# Patient Record
Sex: Female | Born: 1960 | Race: Black or African American | Hispanic: No | Marital: Single | Smoking: Never smoker
Health system: Southern US, Community
[De-identification: ages and names within clinical notes are randomized; demographics above are authoritative.]

## PROBLEM LIST (undated history)

## (undated) DIAGNOSIS — M19071 Primary osteoarthritis, right ankle and foot: Secondary | ICD-10-CM

## (undated) DIAGNOSIS — M545 Low back pain, unspecified: Secondary | ICD-10-CM

## (undated) DIAGNOSIS — K219 Gastro-esophageal reflux disease without esophagitis: Secondary | ICD-10-CM

## (undated) DIAGNOSIS — G8929 Other chronic pain: Secondary | ICD-10-CM

## (undated) DIAGNOSIS — M1990 Unspecified osteoarthritis, unspecified site: Secondary | ICD-10-CM

## (undated) DIAGNOSIS — J302 Other seasonal allergic rhinitis: Secondary | ICD-10-CM

## (undated) HISTORY — DX: Primary osteoarthritis, right ankle and foot: M19.071

## (undated) HISTORY — DX: Other seasonal allergic rhinitis: J30.2

## (undated) HISTORY — DX: Gastro-esophageal reflux disease without esophagitis: K21.9

---

## 2015-03-12 ENCOUNTER — Encounter (HOSPITAL_COMMUNITY): Payer: Self-pay | Admitting: *Deleted

## 2015-03-12 ENCOUNTER — Emergency Department (HOSPITAL_COMMUNITY)
Admission: EM | Admit: 2015-03-12 | Discharge: 2015-03-12 | Disposition: A | Discharge status: Home / Self Care | Payer: No Typology Code available for payment source | Attending: Emergency Medicine | Admitting: Emergency Medicine

## 2015-03-12 DIAGNOSIS — Y998 Other external cause status: Secondary | ICD-10-CM | POA: Insufficient documentation

## 2015-03-12 DIAGNOSIS — S24109A Unspecified injury at unspecified level of thoracic spinal cord, initial encounter: Secondary | ICD-10-CM | POA: Insufficient documentation

## 2015-03-12 DIAGNOSIS — M545 Low back pain, unspecified: Secondary | ICD-10-CM

## 2015-03-12 DIAGNOSIS — R202 Paresthesia of skin: Secondary | ICD-10-CM

## 2015-03-12 DIAGNOSIS — Y9389 Activity, other specified: Secondary | ICD-10-CM | POA: Diagnosis not present

## 2015-03-12 DIAGNOSIS — Y92410 Unspecified street and highway as the place of occurrence of the external cause: Secondary | ICD-10-CM | POA: Insufficient documentation

## 2015-03-12 DIAGNOSIS — G8929 Other chronic pain: Secondary | ICD-10-CM | POA: Insufficient documentation

## 2015-03-12 DIAGNOSIS — S3992XA Unspecified injury of lower back, initial encounter: Secondary | ICD-10-CM | POA: Insufficient documentation

## 2015-03-12 DIAGNOSIS — S4992XA Unspecified injury of left shoulder and upper arm, initial encounter: Secondary | ICD-10-CM | POA: Insufficient documentation

## 2015-03-12 DIAGNOSIS — M549 Dorsalgia, unspecified: Secondary | ICD-10-CM

## 2015-03-12 DIAGNOSIS — Z8739 Personal history of other diseases of the musculoskeletal system and connective tissue: Secondary | ICD-10-CM | POA: Diagnosis not present

## 2015-03-12 DIAGNOSIS — S4991XA Unspecified injury of right shoulder and upper arm, initial encounter: Secondary | ICD-10-CM | POA: Insufficient documentation

## 2015-03-12 HISTORY — DX: Unspecified osteoarthritis, unspecified site: M19.90

## 2015-03-12 HISTORY — DX: Low back pain: M54.5

## 2015-03-12 HISTORY — DX: Low back pain, unspecified: M54.50

## 2015-03-12 HISTORY — DX: Other chronic pain: G89.29

## 2015-03-12 MED ORDER — CYCLOBENZAPRINE HCL 10 MG PO TABS: 10.0000 mg | ORAL_TABLET | Freq: Three times a day (TID) | ORAL | Status: DC | PRN

## 2015-03-12 MED ORDER — IBUPROFEN 800 MG PO TABS: 800.0000 mg | ORAL_TABLET | Freq: Three times a day (TID) | ORAL | Status: DC | PRN
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
## 2015-10-22 DIAGNOSIS — F331 Major depressive disorder, recurrent, moderate: Secondary | ICD-10-CM | POA: Diagnosis not present

## 2016-02-26 DIAGNOSIS — F331 Major depressive disorder, recurrent, moderate: Secondary | ICD-10-CM | POA: Diagnosis not present

## 2016-05-30 ENCOUNTER — Encounter (HOSPITAL_COMMUNITY): Payer: Self-pay

## 2016-05-30 ENCOUNTER — Emergency Department (HOSPITAL_COMMUNITY)
Admission: EM | Admit: 2016-05-30 | Discharge: 2016-05-30 | Disposition: A | Discharge status: Home / Self Care | Payer: No Typology Code available for payment source | Attending: Emergency Medicine | Admitting: Emergency Medicine

## 2016-05-30 DIAGNOSIS — Y92410 Unspecified street and highway as the place of occurrence of the external cause: Secondary | ICD-10-CM | POA: Insufficient documentation

## 2016-05-30 DIAGNOSIS — T148XXA Other injury of unspecified body region, initial encounter: Secondary | ICD-10-CM

## 2016-05-30 DIAGNOSIS — S3992XA Unspecified injury of lower back, initial encounter: Secondary | ICD-10-CM | POA: Diagnosis present

## 2016-05-30 DIAGNOSIS — Y939 Activity, unspecified: Secondary | ICD-10-CM | POA: Diagnosis not present

## 2016-05-30 DIAGNOSIS — S29012A Strain of muscle and tendon of back wall of thorax, initial encounter: Secondary | ICD-10-CM | POA: Insufficient documentation

## 2016-05-30 DIAGNOSIS — S161XXA Strain of muscle, fascia and tendon at neck level, initial encounter: Secondary | ICD-10-CM | POA: Diagnosis not present

## 2016-05-30 DIAGNOSIS — Y999 Unspecified external cause status: Secondary | ICD-10-CM | POA: Diagnosis not present

## 2016-05-30 MED ORDER — METHOCARBAMOL 500 MG PO TABS: 500.0000 mg | ORAL_TABLET | Freq: Two times a day (BID) | ORAL | 0 refills | Status: DC | PRN

## 2016-05-30 MED ORDER — NAPROXEN 500 MG PO TABS: 500.0000 mg | ORAL_TABLET | Freq: Two times a day (BID) | ORAL | 0 refills | Status: DC | PRN

## 2016-05-30 NOTE — Discharge Instructions (Signed)
Is very important that you schedule a follow-up appointment with a primary care provider. You are the pressure was slightly elevated while in the ED today. When you see your primary physician, let them know this. Use Robaxin (your muscle relaxer) as needed. Naproxen is your anti-inflammatory/pain reliever. Return to ER for new or worsening symptoms, any additional concerns.
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
## 2016-06-10 DIAGNOSIS — F331 Major depressive disorder, recurrent, moderate: Secondary | ICD-10-CM | POA: Diagnosis not present

## 2016-09-14 ENCOUNTER — Emergency Department (HOSPITAL_COMMUNITY): Payer: Medicare Other

## 2016-09-14 ENCOUNTER — Encounter (HOSPITAL_COMMUNITY): Payer: Self-pay

## 2016-09-14 ENCOUNTER — Emergency Department (HOSPITAL_COMMUNITY)
Admission: EM | Admit: 2016-09-14 | Discharge: 2016-09-14 | Disposition: A | Discharge status: Home / Self Care | Payer: Medicare Other | Attending: Emergency Medicine | Admitting: Emergency Medicine

## 2016-09-14 DIAGNOSIS — M79645 Pain in left finger(s): Secondary | ICD-10-CM | POA: Diagnosis not present

## 2016-09-14 DIAGNOSIS — M25571 Pain in right ankle and joints of right foot: Secondary | ICD-10-CM | POA: Diagnosis not present

## 2016-09-14 DIAGNOSIS — Y999 Unspecified external cause status: Secondary | ICD-10-CM | POA: Insufficient documentation

## 2016-09-14 DIAGNOSIS — S6992XA Unspecified injury of left wrist, hand and finger(s), initial encounter: Secondary | ICD-10-CM | POA: Diagnosis not present

## 2016-09-14 DIAGNOSIS — Y939 Activity, unspecified: Secondary | ICD-10-CM | POA: Insufficient documentation

## 2016-09-14 DIAGNOSIS — S8991XA Unspecified injury of right lower leg, initial encounter: Secondary | ICD-10-CM | POA: Diagnosis not present

## 2016-09-14 DIAGNOSIS — S4992XA Unspecified injury of left shoulder and upper arm, initial encounter: Secondary | ICD-10-CM | POA: Diagnosis not present

## 2016-09-14 DIAGNOSIS — M25512 Pain in left shoulder: Secondary | ICD-10-CM | POA: Insufficient documentation

## 2016-09-14 DIAGNOSIS — M79661 Pain in right lower leg: Secondary | ICD-10-CM | POA: Diagnosis not present

## 2016-09-14 DIAGNOSIS — M79604 Pain in right leg: Secondary | ICD-10-CM | POA: Insufficient documentation

## 2016-09-14 DIAGNOSIS — Z79899 Other long term (current) drug therapy: Secondary | ICD-10-CM | POA: Insufficient documentation

## 2016-09-14 DIAGNOSIS — M7989 Other specified soft tissue disorders: Secondary | ICD-10-CM | POA: Diagnosis not present

## 2016-09-14 DIAGNOSIS — M79642 Pain in left hand: Secondary | ICD-10-CM | POA: Diagnosis not present

## 2016-09-14 DIAGNOSIS — M25522 Pain in left elbow: Secondary | ICD-10-CM | POA: Diagnosis not present

## 2016-09-14 DIAGNOSIS — Y92410 Unspecified street and highway as the place of occurrence of the external cause: Secondary | ICD-10-CM | POA: Insufficient documentation

## 2016-09-14 MED ORDER — CYCLOBENZAPRINE HCL 10 MG PO TABS: 10.0000 mg | ORAL_TABLET | Freq: Three times a day (TID) | ORAL | 0 refills | Status: DC | PRN

## 2016-09-14 MED ORDER — NAPROXEN 375 MG PO TABS
375.0000 mg | ORAL_TABLET | Freq: Two times a day (BID) | ORAL | 0 refills | Status: DC
Start: 2016-09-14 — End: 2018-02-11

## 2016-09-14 MED ORDER — OXYCODONE-ACETAMINOPHEN 5-325 MG PO TABS
ORAL_TABLET | ORAL | Status: AC
  Filled 2016-09-14: qty 1

## 2016-09-14 MED ORDER — OXYCODONE-ACETAMINOPHEN 5-325 MG PO TABS
1.0000 | ORAL_TABLET | ORAL | Status: DC | PRN
  Administered 2016-09-14: 1 via ORAL

## 2016-09-14 MED ORDER — OXYCODONE-ACETAMINOPHEN 5-325 MG PO TABS
1.0000 | ORAL_TABLET | Freq: Once | ORAL | Status: DC
  Filled 2016-09-14: qty 1
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
## 2016-09-14 NOTE — ED Notes (Signed)
Patient transported to X-ray 
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
## 2016-09-14 NOTE — Progress Notes (Signed)
Orthopedic Tech Progress Note Patient Details:  Gates RiggKaren Mcdevitt 06/03/1961 [id number]  Ortho Devices Type of Ortho Device: Arm sling, Thumb velcro splint Ortho Device/Splint Location: LUE Ortho Device/Splint Interventions: Ordered, Application   Jennye MoccasinHughes, Loi Rennaker Craig 09/14/2016, 10:48 PM
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
## 2016-10-21 DIAGNOSIS — F331 Major depressive disorder, recurrent, moderate: Secondary | ICD-10-CM | POA: Diagnosis not present

## 2016-10-31 DIAGNOSIS — F330 Major depressive disorder, recurrent, mild: Secondary | ICD-10-CM | POA: Diagnosis not present

## 2016-11-02 DIAGNOSIS — F330 Major depressive disorder, recurrent, mild: Secondary | ICD-10-CM | POA: Diagnosis not present

## 2016-11-02 DIAGNOSIS — F331 Major depressive disorder, recurrent, moderate: Secondary | ICD-10-CM | POA: Diagnosis not present

## 2016-11-25 ENCOUNTER — Ambulatory Visit (INDEPENDENT_AMBULATORY_CARE_PROVIDER_SITE_OTHER): Payer: Medicare Other | Admitting: Physician Assistant

## 2016-11-30 DIAGNOSIS — F329 Major depressive disorder, single episode, unspecified: Secondary | ICD-10-CM | POA: Diagnosis not present

## 2016-11-30 DIAGNOSIS — K219 Gastro-esophageal reflux disease without esophagitis: Secondary | ICD-10-CM | POA: Diagnosis not present

## 2016-11-30 DIAGNOSIS — M19071 Primary osteoarthritis, right ankle and foot: Secondary | ICD-10-CM | POA: Diagnosis not present

## 2016-11-30 DIAGNOSIS — G894 Chronic pain syndrome: Secondary | ICD-10-CM | POA: Diagnosis not present

## 2016-11-30 DIAGNOSIS — M5136 Other intervertebral disc degeneration, lumbar region: Secondary | ICD-10-CM | POA: Diagnosis not present

## 2016-12-01 ENCOUNTER — Ambulatory Visit (INDEPENDENT_AMBULATORY_CARE_PROVIDER_SITE_OTHER): Payer: Medicare Other | Admitting: Physician Assistant

## 2016-12-20 ENCOUNTER — Encounter: Payer: Self-pay | Admitting: Physical Medicine & Rehabilitation

## 2016-12-30 ENCOUNTER — Encounter: Payer: Medicare Other | Attending: Physical Medicine & Rehabilitation | Admitting: Physical Medicine & Rehabilitation
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
## 2017-12-14 DIAGNOSIS — F3132 Bipolar disorder, current episode depressed, moderate: Secondary | ICD-10-CM | POA: Diagnosis not present

## 2017-12-14 DIAGNOSIS — F331 Major depressive disorder, recurrent, moderate: Secondary | ICD-10-CM | POA: Diagnosis not present

## 2018-01-23 ENCOUNTER — Emergency Department (HOSPITAL_COMMUNITY)
Admission: EM | Admit: 2018-01-23 | Discharge: 2018-01-23 | Disposition: A | Discharge status: Home / Self Care | Payer: Medicare Other | Attending: Emergency Medicine | Admitting: Emergency Medicine

## 2018-01-23 ENCOUNTER — Encounter (HOSPITAL_COMMUNITY): Payer: Self-pay

## 2018-01-23 ENCOUNTER — Other Ambulatory Visit: Payer: Self-pay

## 2018-01-23 ENCOUNTER — Emergency Department (HOSPITAL_COMMUNITY): Payer: Medicare Other

## 2018-01-23 DIAGNOSIS — R05 Cough: Secondary | ICD-10-CM | POA: Insufficient documentation

## 2018-01-23 DIAGNOSIS — Z5321 Procedure and treatment not carried out due to patient leaving prior to being seen by health care provider: Secondary | ICD-10-CM | POA: Insufficient documentation

## 2018-01-23 NOTE — ED Notes (Signed)
Patient called x 2 with no response.  Nurse observed patient leaving, patient stated she had an appointment at noon today and had to leave.

## 2018-01-23 NOTE — ED Triage Notes (Signed)
Pt reports she has chronic bronchitis and cough. States cough has gotten progressively worse over the last month and thinks she needs abx. Pt denies CP/SOB.

## 2018-01-25 ENCOUNTER — Encounter (HOSPITAL_COMMUNITY): Payer: Self-pay

## 2018-01-25 DIAGNOSIS — R05 Cough: Secondary | ICD-10-CM | POA: Diagnosis not present

## 2018-01-25 DIAGNOSIS — Z5321 Procedure and treatment not carried out due to patient leaving prior to being seen by health care provider: Secondary | ICD-10-CM | POA: Insufficient documentation

## 2018-01-26 ENCOUNTER — Encounter: Payer: Self-pay | Admitting: Family Medicine

## 2018-01-26 ENCOUNTER — Telehealth: Payer: Self-pay | Admitting: Family Medicine

## 2018-01-26 ENCOUNTER — Other Ambulatory Visit: Payer: Self-pay

## 2018-01-26 ENCOUNTER — Emergency Department (HOSPITAL_COMMUNITY)
Admission: EM | Admit: 2018-01-26 | Discharge: 2018-01-26 | Disposition: A | Discharge status: Home / Self Care | Payer: Medicare Other | Attending: Emergency Medicine | Admitting: Emergency Medicine

## 2018-01-26 ENCOUNTER — Ambulatory Visit (INDEPENDENT_AMBULATORY_CARE_PROVIDER_SITE_OTHER): Payer: Medicare Other | Admitting: Family Medicine

## 2018-01-26 VITALS — BP 132/84 | HR 74 | Temp 97.7°F | Ht 64.17 in | Wt 228.8 lb

## 2018-01-26 DIAGNOSIS — R05 Cough: Secondary | ICD-10-CM | POA: Diagnosis not present

## 2018-01-26 DIAGNOSIS — R062 Wheezing: Secondary | ICD-10-CM

## 2018-01-26 DIAGNOSIS — K219 Gastro-esophageal reflux disease without esophagitis: Secondary | ICD-10-CM

## 2018-01-26 DIAGNOSIS — J302 Other seasonal allergic rhinitis: Secondary | ICD-10-CM | POA: Diagnosis not present

## 2018-01-26 DIAGNOSIS — R053 Chronic cough: Secondary | ICD-10-CM

## 2018-01-26 MED ORDER — ALBUTEROL SULFATE HFA 108 (90 BASE) MCG/ACT IN AERS: 2.0000 | INHALATION_SPRAY | Freq: Four times a day (QID) | RESPIRATORY_TRACT | 3 refills | Status: DC | PRN

## 2018-01-26 MED ORDER — MONTELUKAST SODIUM 10 MG PO TABS: 10.0000 mg | ORAL_TABLET | Freq: Every day | ORAL | 3 refills | Status: DC

## 2018-01-26 MED ORDER — FLUTICASONE PROPIONATE 50 MCG/ACT NA SUSP: 1.0000 | Freq: Two times a day (BID) | NASAL | 6 refills | Status: DC

## 2018-01-26 MED ORDER — GUAIFENESIN-CODEINE 100-10 MG/5ML PO SOLN: 10.0000 mL | Freq: Two times a day (BID) | ORAL | 0 refills | Status: DC | PRN

## 2018-01-26 NOTE — ED Notes (Signed)
Pt called from triage with no answer 

## 2018-01-26 NOTE — Patient Instructions (Addendum)
1. 24 hour allergy medication daily, generic claritin ok.  2. Nasal saline washes 2-3 times a day    IF you received an x-ray today, you will receive an invoice from [facility]. Please contact [facility] at [phone] with questions or concerns regarding your invoice.   IF you received labwork today, you will receive an invoice from [facility]. Please contact [facility] at [phone] with questions or concerns regarding your invoice.   Our billing staff will not be able to assist you with questions regarding bills from these companies.  You will be contacted with the lab results as soon as they are available. The fastest way to get your results is to activate your My Chart account. Instructions are located on the last page of this paperwork. If you have not heard from [place] regarding the results in 2 weeks, please contact this office.         You can buy saline nose drops at a pharmacy, or you can make your own saline solution: Add 1 cup (240 mL) distilled water to a clean container. If you use tap water, boil it first to sterilize it, and then let it cool until it is lukewarm.  Add 0.5 tsp (2.5 g) salt to the water.  Add 0.5 tsp (2.5 g) baking soda. You can store homemade saline solution at room temperature for 3 days. To use homemade saline solution as a nasal wash: Fill a large medical syringe, squeeze bottle, or nasal cleansing pot (such as a Neti Pot) with the saline solution, insert the tip into your nostril, and squeeze gently.  Aim the stream of saline solution toward the back of your head, not toward the top.  The saline wash should go through the nose and out the mouth or the other side of the nose.  Blow your nose gently after the saline wash unless your doctor has told you not to blow your nose.  Repeat several times every day.  Clean the syringe or bottle after each use. Here are some things to think about: Do your saline wash before you use your other  nasal medicines. The wash will help your sinuses absorb the medicine.  You can warm the saline solution a little. But make sure it's not hot.  The saline wash may cause a burning feeling in your nose the first few times you use it. Most people get used to the wash after a few times.

## 2018-01-26 NOTE — Telephone Encounter (Signed)
Spoke with patient. Answered/clarified questions/concerns. Sent rx for cough suppressant. Insurance does not cover tessalon pearls.

## 2018-01-26 NOTE — Telephone Encounter (Signed)
Copied from CRM [id number]. Topic: Quick Communication - Rx Refill/Question >> Jan 26, 2018  3:20 PM Cipriano Bunker wrote: Medication:  she is asking for a prescription for her throat. Irritation in throat   Has the patient contacted their pharmacy? No. (Agent: If no, request that the patient contact the pharmacy for the refill.) Preferred Pharmacy (with phone number or street name):   [facility] [address] [place], [place] - [address] AT [facility] [address] [place] [address] Phone: [phone] Fax: [phone]   Agent: Please be advised that RX refills may take up to 3 business days. We ask that you follow-up with your pharmacy.

## 2018-01-26 NOTE — Telephone Encounter (Signed)
Seen this afternoon, is flonase prescription for patient request? Please advise.

## 2018-01-26 NOTE — Progress Notes (Signed)
5/10/20192:06 PM  Sabrina Mann 05-09-1961, 57 y.o. female [id number]  Chief Complaint  Patient presents with  . Cough    born with bronchitis has coughing episode and asking antibiotic    HPI:   Patient is a 57 y.o. female with past medical history significant for recurrent bronchitis who presents today for cough  Patient reports 2 months of constant cough, productive, white phlegm Intermittent SOB and wheezing She reports an irritated throat but not painful, runny nose, nasal congestion, sneezing, itchy L eye She denies ear pain, sinus pain or pressure She has occasional nausea but no vomiting Denies any fever or chills  She has been taking delsym cold/flu cough syrup. She is tired from not sleeping well for past 2 months  She reports flare ups several times a year She reports when she used to leave in [place] 3 years ago (recently moved back) she was prescribed nexium and loratidine,for similar symptoms and they resolved. Omeprazole not has good, but it is what is covered by insurance  Went to ER twice, left wo being seen CXR 01/23/18 - negative  Fall Risk  01/26/2018  Falls in the past year? No     Depression screen PHQ 2/9 01/26/2018  Decreased Interest 0  Down, Depressed, Hopeless 0  PHQ - 2 Score 0    No Known Allergies  Prior to Admission medications   Medication Sig Start Date End Date Taking? Authorizing Provider  omeprazole (PRILOSEC) 40 MG capsule Take 40 mg by mouth daily.   Yes [provider]  traMADol-acetaminophen (ULTRACET) 37.5-325 MG tablet Take 1 tablet by mouth every 6 (six) hours as needed.   Yes [provider]  buproprion  once a day  Past Medical History:  Diagnosis Date  . Arthritis of right ankle   . Chronic lower back pain   . GERD (gastroesophageal reflux disease)   . Seasonal allergies     History reviewed. No pertinent surgical history.  Social History   Tobacco Use  . Smoking status: Never Smoker  .  Smokeless tobacco: Never Used  Substance Use Topics  . Alcohol use: No    History reviewed. No pertinent family history.  Review of Systems  Constitutional: Negative for chills and fever.  HENT: Positive for congestion. Negative for ear pain, sinus pain and sore throat.   Eyes: Negative for pain and redness.  Respiratory: Positive for cough, sputum production, shortness of breath and wheezing.   Cardiovascular: Negative for chest pain, palpitations and leg swelling.  Gastrointestinal: Positive for heartburn. Negative for abdominal pain, blood in stool, constipation, diarrhea, melena, nausea and vomiting.  Musculoskeletal: Positive for back pain and joint pain.  Psychiatric/Behavioral: Positive for depression.   OBJECTIVE:  Blood pressure 132/84, pulse 74, temperature 97.7 F (36.5 C), temperature source Oral, height 5' 4.17" (1.63 m), weight 228 lb 12.8 oz (103.8 kg), SpO2 98 %.  Physical Exam  Constitutional: She is oriented to person, place, and time. She appears well-developed and well-nourished.  HENT:  Head: Normocephalic and atraumatic.  Right Ear: Hearing, tympanic membrane, external ear and ear canal normal.  Left Ear: Hearing, tympanic membrane, external ear and ear canal normal.  Nose: Mucosal edema and rhinorrhea present.  Mouth/Throat: Oropharynx is clear and moist.  Eyes: Pupils are equal, round, and reactive to light. Conjunctivae and EOM are normal.  Neck: Neck supple.  Cardiovascular: Normal rate, regular rhythm and normal heart sounds. Exam reveals no gallop and no friction rub.  No murmur heard. Pulmonary/Chest: Effort  normal and breath sounds normal. She has no wheezes. She has no rhonchi. She has no rales.  Lymphadenopathy:    She has no cervical adenopathy.  Neurological: She is alert and oriented to person, place, and time.  Skin: Skin is warm and dry.  Psychiatric: She has a normal mood and affect.  Nursing note and vitals reviewed.    ASSESSMENT and  PLAN  1. Seasonal allergies 2. Wheezing 3. Chronic cough 4. Gastroesophageal reflux disease, esophagitis presence not specified Patient with a history of chronic cough that is triggered by uncontrolled allergies and GERD. Discussed with patient that no indication for abx at this time. Discussed improving treatment of allergies. New meds r/se/b reviewed. rx albuterol inhaler as she reports wheezing. Cough syrup to provide respite for sleep while above measures start taking effect. Per patient, nexium not covered, discussed importance of LFM for mgt of GERD.   Other orders - fluticasone (FLONASE) 50 MCG/ACT nasal spray; Place 1 spray into both nostrils 2 (two) times daily. - montelukast (SINGULAIR) 10 MG tablet; Take 1 tablet (10 mg total) by mouth at bedtime. - albuterol (PROVENTIL HFA;VENTOLIN HFA) 108 (90 Base) MCG/ACT inhaler; Inhale 2 puffs into the lungs every 6 (six) hours as needed for wheezing or shortness of breath. - guaiFENesin-codeine 100-10 MG/5ML syrup; Take 10 mLs by mouth 2 (two) times daily as needed for cough.   Return in about 3 weeks (around 02/16/2018).    Myles Lipps, MD Primary Care at [facility] [address] [place], [place] [address] Ph.  [phone] Fax [phone]

## 2018-02-11 ENCOUNTER — Encounter: Payer: Self-pay | Admitting: Family Medicine

## 2018-02-11 DIAGNOSIS — M19071 Primary osteoarthritis, right ankle and foot: Secondary | ICD-10-CM | POA: Insufficient documentation

## 2018-02-11 DIAGNOSIS — K219 Gastro-esophageal reflux disease without esophagitis: Secondary | ICD-10-CM | POA: Insufficient documentation

## 2018-02-11 DIAGNOSIS — J302 Other seasonal allergic rhinitis: Secondary | ICD-10-CM | POA: Insufficient documentation

## 2018-02-15 ENCOUNTER — Ambulatory Visit: Payer: Medicare Other | Admitting: Family Medicine

## 2018-02-22 ENCOUNTER — Telehealth: Payer: Self-pay | Admitting: Family Medicine

## 2018-02-22 NOTE — Telephone Encounter (Signed)
Copied from CRM [id number]. Topic: Quick Communication - See Telephone Encounter >> Feb 22, 2018  9:41 AM Lorrine KinMcGee, Bayron Dalto B, NT wrote: CRM for notification. See Telephone encounter for: 02/22/18. Patient calling and states that she is still coughing really bad and would like to know if [facility] cough [facility] [place], [place] - [address] CB#: [phone]

## 2018-02-22 NOTE — Telephone Encounter (Signed)
Please schedule. Pt is due for f/u visit. Lets see if we can move up visit from schedule date.

## 2018-03-01 ENCOUNTER — Encounter: Payer: Self-pay | Admitting: Family Medicine

## 2018-03-01 ENCOUNTER — Other Ambulatory Visit: Payer: Self-pay

## 2018-03-01 ENCOUNTER — Ambulatory Visit (INDEPENDENT_AMBULATORY_CARE_PROVIDER_SITE_OTHER): Payer: Medicare Other | Admitting: Family Medicine

## 2018-03-01 VITALS — BP 116/86 | HR 100 | Temp 98.0°F | Ht 64.17 in | Wt 225.2 lb

## 2018-03-01 DIAGNOSIS — K219 Gastro-esophageal reflux disease without esophagitis: Secondary | ICD-10-CM

## 2018-03-01 DIAGNOSIS — J302 Other seasonal allergic rhinitis: Secondary | ICD-10-CM

## 2018-03-01 MED ORDER — TRIAMCINOLONE ACETONIDE 55 MCG/ACT NA AERO: 1.0000 | INHALATION_SPRAY | Freq: Two times a day (BID) | NASAL | 12 refills | Status: DC

## 2018-03-01 MED ORDER — MONTELUKAST SODIUM 10 MG PO TABS: 10.0000 mg | ORAL_TABLET | Freq: Every day | ORAL | 3 refills | Status: AC

## 2018-03-01 MED ORDER — ESOMEPRAZOLE MAGNESIUM 40 MG PO CPDR: 40.0000 mg | DELAYED_RELEASE_CAPSULE | Freq: Every day | ORAL | 2 refills | Status: DC

## 2018-03-01 NOTE — Progress Notes (Signed)
6/13/20193:15 PM  Sabrina Mann 1961-09-12, 57 y.o. female [id number]  Chief Complaint  Patient presents with  . Follow-up    couggh has gotten a lot better, only coughs when she eats spicy foods    HPI:   Patient is a 57 y.o. female with past medical history significant for seasonal allergies and GERD who presents today for followup  Cough much better Still having sore throat, irritation Omeprazole 40mg  not providing relief, she would like to get back on nexium flonase is also causing some irritation after each use She has no acute concerns today  Fall Risk  03/01/2018 01/26/2018  Falls in the past year? No No     Depression screen [facility] 2/9 03/01/2018 01/26/2018  Decreased Interest 0 0  Down, Depressed, Hopeless 0 0  PHQ - 2 Score 0 0    No Known Allergies  Prior to Admission medications   Medication Sig Start Date End Date Taking? Authorizing Provider  albuterol (PROVENTIL HFA;VENTOLIN HFA) 108 (90 Base) MCG/ACT inhaler Inhale 2 puffs into the lungs every 6 (six) hours as needed for wheezing or shortness of breath. 01/26/18  Yes Myles Lipps, MD  buPROPion (WELLBUTRIN) 75 MG tablet Take 1 tablet by mouth daily. 01/05/17  Yes [provider]  fluticasone (FLONASE) 50 MCG/ACT nasal spray Place 1 spray into both nostrils 2 (two) times daily. 01/26/18  Yes Myles Lipps, MD  guaiFENesin-codeine 100-10 MG/5ML syrup Take 10 mLs by mouth 2 (two) times daily as needed for cough. 01/26/18   Myles Lipps, MD  montelukast (SINGULAIR) 10 MG tablet Take 1 tablet (10 mg total) by mouth at bedtime. Patient not taking: Reported on 03/01/2018 01/26/18   Myles Lipps, MD  omeprazole (PRILOSEC) 40 MG capsule Take 40 mg by mouth daily as needed. 11/30/16   [provider]  traMADol-acetaminophen (ULTRACET) 37.5-325 MG tablet Take 1 tablet by mouth every 6 (six) hours as needed.    [provider]    Past Medical History:  Diagnosis Date  . Arthritis of  right ankle   . Chronic lower back pain   . GERD (gastroesophageal reflux disease)   . Seasonal allergies     History reviewed. No pertinent surgical history.  Social History   Tobacco Use  . Smoking status: Never Smoker  . Smokeless tobacco: Never Used  Substance Use Topics  . Alcohol use: No    Family History  Problem Relation Age of Onset  . Healthy Mother   . Healthy Father     ROS Per hpi  OBJECTIVE:  Blood pressure 116/86, pulse 100, temperature 98 F (36.7 C), temperature source Oral, height 5' 4.17" (1.63 m), weight 225 lb 3.2 oz (102.2 kg), SpO2 96 %.  Physical Exam  Constitutional: She is oriented to person, place, and time. She appears well-developed and well-nourished.  HENT:  Head: Normocephalic and atraumatic.  Mouth/Throat: Mucous membranes are normal.  Eyes: Pupils are equal, round, and reactive to light. EOM are normal. No scleral icterus.  Neck: Neck supple.  Pulmonary/Chest: Effort normal.  Neurological: She is alert and oriented to person, place, and time.  Skin: Skin is warm and dry.  Psychiatric: She has a normal mood and affect.  Nursing note and vitals reviewed.   ASSESSMENT and PLAN  1. Seasonal allergies 2. Gastroesophageal reflux disease, esophagitis presence not specified Improving. Changes meds per below due to side effects or lack of therapeutic effect. Continue with LFM.  Other orders - montelukast (SINGULAIR) 10  MG tablet; Take 1 tablet (10 mg total) by mouth at bedtime. - esomeprazole (NEXIUM) 40 MG capsule; Take 1 capsule (40 mg total) by mouth daily at 12 noon. - triamcinolone (NASACORT) 55 MCG/ACT AERO nasal inhaler; Place 1 spray into the nose 2 (two) times daily.  Return in about 1 month (around 03/29/2018).    Myles LippsIrma M Santiago, MD Primary Care at [facility] [address] [place], [place] [address] Ph.  [phone] Fax [phone]

## 2018-03-01 NOTE — Patient Instructions (Signed)
     IF you received an x-ray today, you will receive an invoice from [facility]. Please contact [facility] at [phone] with questions or concerns regarding your invoice.   IF you received labwork today, you will receive an invoice from [facility]. Please contact [facility] at [phone] with questions or concerns regarding your invoice.   Our billing staff will not be able to assist you with questions regarding bills from these companies.  You will be contacted with the lab results as soon as they are available. The fastest way to get your results is to activate your My Chart account. Instructions are located on the last page of this paperwork. If you have not heard from us regarding the results in 2 weeks, please contact this office.     

## 2018-03-26 ENCOUNTER — Ambulatory Visit: Payer: Medicare Other | Admitting: Family Medicine

## 2018-06-19 DIAGNOSIS — F331 Major depressive disorder, recurrent, moderate: Secondary | ICD-10-CM | POA: Diagnosis not present

## 2018-09-26 DIAGNOSIS — H524 Presbyopia: Secondary | ICD-10-CM | POA: Diagnosis not present

## 2018-09-26 DIAGNOSIS — H5203 Hypermetropia, bilateral: Secondary | ICD-10-CM | POA: Diagnosis not present

## 2018-09-26 DIAGNOSIS — H52223 Regular astigmatism, bilateral: Secondary | ICD-10-CM | POA: Diagnosis not present

## 2019-03-14 ENCOUNTER — Telehealth: Payer: Self-pay | Admitting: Family Medicine

## 2019-03-14 ENCOUNTER — Telehealth: Payer: Self-pay

## 2019-03-14 NOTE — Telephone Encounter (Signed)
Dr. Everlene Farrier completed [employer] handicapped plaque form for pt, pt was also asked to make a f/u appt.

## 2019-03-14 NOTE — Telephone Encounter (Signed)
Pt dropped off NON FMLA paperwork to be signed by provider for handicap placard. Pt wants to pick up signed form today. In provider mailbox at nurses station.

## 2019-03-25 NOTE — Telephone Encounter (Signed)
Placed in the front box for pick up, you also asked that this pt make an appt. Spoke with her. Note sure if it was picked up, not in box anymore. Called, na

## 2019-11-27 ENCOUNTER — Telehealth: Payer: Self-pay | Admitting: *Deleted

## 2019-11-27 NOTE — Telephone Encounter (Signed)
SCHEDULE AWV 

## 2020-02-14 ENCOUNTER — Ambulatory Visit (INDEPENDENT_AMBULATORY_CARE_PROVIDER_SITE_OTHER): Payer: Medicare Other | Admitting: Family Medicine

## 2020-02-14 ENCOUNTER — Other Ambulatory Visit: Payer: Self-pay

## 2020-02-14 ENCOUNTER — Encounter: Payer: Self-pay | Admitting: Family Medicine

## 2020-02-14 VITALS — BP 128/74 | HR 71 | Temp 98.2°F | Ht 64.17 in | Wt 226.6 lb

## 2020-02-14 DIAGNOSIS — F4321 Adjustment disorder with depressed mood: Secondary | ICD-10-CM | POA: Diagnosis not present

## 2020-02-14 DIAGNOSIS — M7711 Lateral epicondylitis, right elbow: Secondary | ICD-10-CM

## 2020-02-14 MED ORDER — BUPROPION HCL 75 MG PO TABS: 75.0000 mg | ORAL_TABLET | Freq: Every day | ORAL | 5 refills | Status: DC

## 2020-02-14 MED ORDER — HYDROCODONE-ACETAMINOPHEN 5-325 MG PO TABS: 1.0000 | ORAL_TABLET | Freq: Four times a day (QID) | ORAL | 0 refills | Status: DC | PRN

## 2020-02-14 MED ORDER — MELOXICAM 15 MG PO TABS: 15.0000 mg | ORAL_TABLET | Freq: Every day | ORAL | 2 refills | Status: DC | PRN

## 2020-02-14 NOTE — Progress Notes (Signed)
5/28/20219:45 AM  Gates Rigg 1961/02/10, 58 y.o., female [id number]  Chief Complaint  Patient presents with  . Hypertension    asking for a bp monitor to take bp at home  . Pain    right arm pain that radiates under the arm for the past 2 weeks, asking for a stronger med than the tramadol    HPI:   Patient is a 59 y.o. female with past medical history significant for depression, seasonal allergies, GERD who presents today for several concerns  Right elbow pain that travels down and up her arm Started about 2 weeks ago, abrupt onset Pain is constant, she has been taking tramadol-apap When at rest she feels it pull down from her neck all the way down into her pinkie Pain is heavy, achy and sharp and started having tingling of her fingertips yesterday No neck pain but right shoulder blade is painful Left handed No swelling, redness warmth of joints No weakness  Requesting to restart wellbutrin  Has not been on this for a while Has noticed return of depression with increased problems with intertest and focus In the past these sx resolved completely with wellbutrin She would like to restart  Depression screen [facility] 2/9 02/14/2020 03/01/2018 01/26/2018  Decreased Interest 0 0 0  Down, Depressed, Hopeless 0 0 0  PHQ - 2 Score 0 0 0    Fall Risk  02/14/2020 03/01/2018 01/26/2018  Falls in the past year? 0 No No  Number falls in past yr: 0 - -  Injury with Fall? 0 - -     No Known Allergies  Prior to Admission medications   Medication Sig Start Date End Date Taking? Authorizing Provider  albuterol (PROVENTIL HFA;VENTOLIN HFA) 108 (90 Base) MCG/ACT inhaler Inhale 2 puffs into the lungs every 6 (six) hours as needed for wheezing or shortness of breath. 01/26/18  Yes Myles Lipps, MD  buPROPion (WELLBUTRIN) 75 MG tablet Take 1 tablet by mouth daily. 01/05/17  Yes [provider]  esomeprazole (NEXIUM) 40 MG capsule Take 1 capsule (40 mg total) by mouth daily at 12 noon.  03/01/18  Yes Myles Lipps, MD  montelukast (SINGULAIR) 10 MG tablet Take 1 tablet (10 mg total) by mouth at bedtime. 03/01/18  Yes Myles Lipps, MD  traMADol-acetaminophen (ULTRACET) 37.5-325 MG tablet Take 1 tablet by mouth every 6 (six) hours as needed.   Yes [provider]  triamcinolone (NASACORT) 55 MCG/ACT AERO nasal inhaler Place 1 spray into the nose 2 (two) times daily. 03/01/18  Yes Myles Lipps, MD    Past Medical History:  Diagnosis Date  . Arthritis of right ankle   . Chronic lower back pain   . GERD (gastroesophageal reflux disease)   . Seasonal allergies     No past surgical history on file.  Social History   Tobacco Use  . Smoking status: Never Smoker  . Smokeless tobacco: Never Used  Substance Use Topics  . Alcohol use: No    Family History  Problem Relation Age of Onset  . Healthy Mother   . Healthy Father     ROS Per hpi  OBJECTIVE:  Today's Vitals   02/14/20 0936  BP: 128/74  Pulse: 71  Temp: 98.2 F (36.8 C)  SpO2: 99%  Weight: 226 lb 9.6 oz (102.8 kg)  Height: 5' 4.17" (1.63 m)   Body mass index is 38.69 kg/m.   Physical Exam Vitals and nursing note reviewed.  Constitutional:  Appearance: She is well-developed.  HENT:     Head: Normocephalic and atraumatic.     Mouth/Throat:     Pharynx: No oropharyngeal exudate.  Eyes:     General: No scleral icterus.    Conjunctiva/sclera: Conjunctivae normal.     Pupils: Pupils are equal, round, and reactive to light.  Cardiovascular:     Rate and Rhythm: Normal rate and regular rhythm.     Heart sounds: Normal heart sounds. No murmur. No friction rub. No gallop.   Pulmonary:     Effort: Pulmonary effort is normal.     Breath sounds: Normal breath sounds. No wheezing or rales.  Musculoskeletal:     Right shoulder: Normal.     Right elbow: No swelling. Normal range of motion. Tenderness present in lateral epicondyle.     Right wrist: Normal.     Cervical back: Neck  supple.  Skin:    General: Skin is warm and dry.  Neurological:     Mental Status: She is alert and oriented to person, place, and time.     No results found for this or any previous visit (from the past 24 hour(s)).  No results found.   ASSESSMENT and PLAN  1. Right lateral epicondylitis Discussed supportive measures, new meds r/se/b and RTC precautions. Patient educational handout given. pmp reviewed  2. Adjustment disorder with depressed mood Restarting wellbutrin, reviewed r/se/b  Other orders - meloxicam (MOBIC) 15 MG tablet; Take 1 tablet (15 mg total) by mouth daily as needed for pain (take with food). - HYDROcodone-acetaminophen (NORCO) 5-325 MG tablet; Take 1 tablet by mouth every 6 (six) hours as needed. - buPROPion (WELLBUTRIN) 75 MG tablet; Take 1 tablet (75 mg total) by mouth daily.  Return in about 3 months (around 05/16/2020).    Rutherford Guys, MD Primary Care at [facility] [place], [place] [address] Ph.  [phone] Fax [phone]

## 2020-02-14 NOTE — Patient Instructions (Addendum)
   If you have lab work done today you will be contacted with your lab results within the next 2 weeks.  If you have not heard from us then please contact us. The fastest way to get your results is to register for My Chart.   IF you received an x-ray today, you will receive an invoice from [facility]. Please contact [facility] at [phone] with questions or concerns regarding your invoice.   IF you received labwork today, you will receive an invoice from [facility]. Please contact [facility] at [phone] with questions or concerns regarding your invoice.   Our billing staff will not be able to assist you with questions regarding bills from these companies.  You will be contacted with the lab results as soon as they are available. The fastest way to get your results is to activate your My Chart account. Instructions are located on the last page of this paperwork. If you have not heard from us regarding the results in 2 weeks, please contact this office.     Tennis Elbow Tennis elbow is swelling (inflammation) in your outer forearm, near your elbow. Swelling affects the tissues that connect muscle to bone (tendons). Tennis elbow can happen in any sport or job in which you use your elbow too much. It is caused by doing the same motion over and over. Tennis elbow can cause:  Pain and tenderness in your forearm and the outer part of your elbow. You may have pain all the time, or only when using the arm.  A burning feeling. This runs from your elbow through your arm.  Weak grip in your hand. Follow these instructions at home: Activity  Rest your elbow and wrist. Avoid activities that cause problems, as told by your doctor.  If told by your doctor, wear an elbow strap to reduce stress on the area.  Do physical therapy exercises as told.  If you lift an object, lift it with your palm facing up. This is easier on your elbow. Lifestyle  If your tennis elbow is caused  by sports, check your equipment and make sure that: ? You are using it correctly. ? It fits you well.  If your tennis elbow is caused by work or by using a computer, take breaks often to stretch your arm. Talk with your manager about how you can manage your condition at work. If you have a brace:  Wear the brace as told by your doctor. Remove it only as told by your doctor.  Loosen the brace if your fingers tingle, get numb, or turn cold and blue.  Keep the brace clean.  If the brace is not waterproof, ask your doctor if you may take the brace off for bathing. If you must keep the brace on while bathing: ? Do not let it get wet. ? Cover it with a watertight covering when you take a bath or a shower. General instructions   If told, put ice on the painful area: ? Put ice in a plastic bag. ? Place a towel between your skin and the bag. ? Leave the ice on for 20 minutes, 2-3 times a day.  Take over-the-counter and prescription medicines only as told by your doctor.  Keep all follow-up visits as told by your doctor. This is important. Contact a doctor if:  Your pain does not get better with treatment.  Your pain gets worse.  You have weakness in your forearm, hand, or fingers.  You cannot feel   your forearm, hand, or fingers. Summary  Tennis elbow is swelling (inflammation) in your outer forearm, near your elbow.  Tennis elbow is caused by doing the same motion over and over.  Rest your elbow and wrist. Avoid activities that cause problems, as told by your doctor.  If told, put ice on the painful area for 20 minutes, 2-3 times a day. This information is not intended to replace advice given to you by your health care provider. Make sure you discuss any questions you have with your health care provider. Document Revised: 06/01/2018 Document Reviewed: 06/20/2017 Elsevier Patient Education  2020 [employer] Inc[employer]  

## 2020-04-29 ENCOUNTER — Telehealth: Payer: Self-pay | Admitting: General Practice

## 2020-05-18 ENCOUNTER — Other Ambulatory Visit: Payer: Self-pay

## 2020-05-18 ENCOUNTER — Ambulatory Visit (INDEPENDENT_AMBULATORY_CARE_PROVIDER_SITE_OTHER): Payer: Medicare Other

## 2020-05-18 ENCOUNTER — Encounter: Payer: Self-pay | Admitting: Family Medicine

## 2020-05-18 ENCOUNTER — Ambulatory Visit (INDEPENDENT_AMBULATORY_CARE_PROVIDER_SITE_OTHER): Payer: Medicare Other | Admitting: Family Medicine

## 2020-05-18 VITALS — BP 140/90 | HR 73 | Temp 97.3°F | Ht 64.17 in | Wt 224.4 lb

## 2020-05-18 DIAGNOSIS — M797 Fibromyalgia: Secondary | ICD-10-CM | POA: Insufficient documentation

## 2020-05-18 DIAGNOSIS — M25571 Pain in right ankle and joints of right foot: Secondary | ICD-10-CM | POA: Diagnosis not present

## 2020-05-18 DIAGNOSIS — M19071 Primary osteoarthritis, right ankle and foot: Secondary | ICD-10-CM

## 2020-05-18 DIAGNOSIS — M47817 Spondylosis without myelopathy or radiculopathy, lumbosacral region: Secondary | ICD-10-CM | POA: Insufficient documentation

## 2020-05-18 DIAGNOSIS — M51379 Other intervertebral disc degeneration, lumbosacral region without mention of lumbar back pain or lower extremity pain: Secondary | ICD-10-CM | POA: Insufficient documentation

## 2020-05-18 DIAGNOSIS — M47816 Spondylosis without myelopathy or radiculopathy, lumbar region: Secondary | ICD-10-CM | POA: Insufficient documentation

## 2020-05-18 DIAGNOSIS — M25579 Pain in unspecified ankle and joints of unspecified foot: Secondary | ICD-10-CM | POA: Insufficient documentation

## 2020-05-18 DIAGNOSIS — M5137 Other intervertebral disc degeneration, lumbosacral region: Secondary | ICD-10-CM | POA: Insufficient documentation

## 2020-05-18 MED ORDER — MELOXICAM 15 MG PO TABS: 15.0000 mg | ORAL_TABLET | Freq: Every day | ORAL | 2 refills | Status: DC | PRN

## 2020-05-18 NOTE — Telephone Encounter (Signed)
Pt has left the office, however the visit was not complete. We were waiting on the X-ray results that she had just taken. As of right now they are not back yet. The provider has asked me to call pt to inform her that we are waiting on X-rays, however in the mean time we have referred pt to podiatry and also sent in some oral medication to assist with the pain until she is seen by the specialist. Pt stated "okay, sent it to where you want to".   Results just came back and the athirst has gotten worse. She can try to wear an ankle brace for that will help with the pain. I have called pt back and relayed the results. The pt has requested a letter with result and I stated understanding. We will send a copy of them to her.

## 2020-05-18 NOTE — Patient Instructions (Signed)
° ° ° °  If you have lab work done today you will be contacted with your lab results within the next 2 weeks.  If you have not heard from us then please contact us. The fastest way to get your results is to register for My Chart. ° ° °IF you received an x-ray today, you will receive an invoice from [facility]. Please contact  Radiology at [phone] with questions or concerns regarding your invoice.  ° °IF you received labwork today, you will receive an invoice from [facility]. Please contact [facility] at [phone] with questions or concerns regarding your invoice.  ° °Our billing staff will not be able to assist you with questions regarding bills from these companies. ° °You will be contacted with the lab results as soon as they are available. The fastest way to get your results is to activate your My Chart account. Instructions are located on the last page of this paperwork. If you have not heard from us regarding the results in 2 weeks, please contact this office. °  ° ° ° °

## 2020-05-18 NOTE — Progress Notes (Signed)
8/30/20218:24 AM  Sabrina Mann 06/12/1961, 59 y.o., female [id number]  Chief Complaint  Patient presents with  . Pain    medication tht used for pain is not working, requesting xray on right leg. Told yrs ago there is not cartridge in that leg, bone rubbing against bone    HPI:   Patient is a 59 y.o. female with past medical history significant for depression, seasonal allergies, GERD  who presents today for right ankle pain  Chronic right ankle pain that is worsening, now constant She is requesting new xrays She denies any injuries or surgeries She reports pain travels from ankle to lower calf She reports that her ankle remains swollen She reports being told there was no cartilage She is on disability due to this pain She last saw a specialist over 10 years ago She reports that she was last rx tramadol [facility] She has not been to [facility] over a year due to covid Ankle Xray from 2017 - [facility]       Depression screen [facility] 2/9 02/14/2020 03/01/2018 01/26/2018  Decreased Interest 0 0 0  Down, Depressed, Hopeless 0 0 0  PHQ - 2 Score 0 0 0    Fall Risk  02/14/2020 03/01/2018 01/26/2018  Falls in the past year? 0 No No  Number falls in past yr: 0 - -  Injury with Fall? 0 - -     No Known Allergies  Prior to Admission medications   Medication Sig Start Date End Date Taking? Authorizing Provider  albuterol (PROVENTIL HFA;VENTOLIN HFA) 108 (90 Base) MCG/ACT inhaler Inhale 2 puffs into the lungs every 6 (six) hours as needed for wheezing or shortness of breath. 01/26/18  Yes Myles Lipps, MD  buPROPion (WELLBUTRIN) 75 MG tablet Take 1 tablet (75 mg total) by mouth daily. 02/14/20  Yes Myles Lipps, MD  esomeprazole (NEXIUM) 40 MG capsule Take 1 capsule (40 mg total) by mouth daily at 12 noon. 03/01/18  Yes Myles Lipps, MD  meloxicam (MOBIC) 15 MG tablet Take 1 tablet (15 mg total) by mouth daily as needed for pain (take with food). 02/14/20  Yes Myles Lipps, MD    montelukast (SINGULAIR) 10 MG tablet Take 1 tablet (10 mg total) by mouth at bedtime. 03/01/18  Yes Myles Lipps, MD  traMADol-acetaminophen (ULTRACET) 37.5-325 MG tablet tramadol 37.5 mg-acetaminophen 325 mg tablet  Take 1 tablet 3 times a day by oral route as needed for 30 days.   Yes [provider]  triamcinolone (NASACORT) 55 MCG/ACT AERO nasal inhaler Place 1 spray into the nose 2 (two) times daily. 03/01/18  Yes Myles Lipps, MD    Past Medical History:  Diagnosis Date  . Arthritis of right ankle   . Chronic lower back pain   . GERD (gastroesophageal reflux disease)   . Seasonal allergies     No past surgical history on file.  Social History   Tobacco Use  . Smoking status: Never Smoker  . Smokeless tobacco: Never Used  Substance Use Topics  . Alcohol use: No    Family History  Problem Relation Age of Onset  . Healthy Mother   . Healthy Father     ROS Per hpi  OBJECTIVE:  Today's Vitals   05/18/20 0814  BP: 140/90  Pulse: 73  Temp: (!) 97.3 F (36.3 C)  SpO2: 96%  Weight: 224 lb 6.4 oz (101.8 kg)  Height: 5' 4.17" (1.63 m)   Body mass index  is 38.31 kg/m.   Physical Exam Vitals and nursing note reviewed.  Constitutional:      Appearance: She is well-developed.  HENT:     Head: Normocephalic and atraumatic.  Eyes:     General: No scleral icterus.    Conjunctiva/sclera: Conjunctivae normal.     Pupils: Pupils are equal, round, and reactive to light.  Pulmonary:     Effort: Pulmonary effort is normal.  Musculoskeletal:     Cervical back: Neck supple.     Right ankle: Swelling present. Tenderness present over the medial malleolus. Decreased range of motion. Normal pulse.     Right Achilles Tendon: Normal.     Comments: Pes planus  Skin:    General: Skin is warm and dry.  Neurological:     Mental Status: She is alert and oriented to person, place, and time.     No results found for this or any previous visit (from the past  24 hour(s)).  DG Ankle Complete Right  Result Date: 05/18/2020 CLINICAL DATA:  Worsening right ankle pain.  No reported injury. EXAM: RIGHT ANKLE - COMPLETE 3+ VIEW COMPARISON:  09/14/2016 right ankle radiographs FINDINGS: Diffuse right ankle soft tissue swelling. No fracture or subluxation. No suspicious focal osseous lesions. Moderate to severe right ankle joint osteoarthritis with loss of joint space and moderate marginal osteophytes, mildly progressive. Tiny plantar right calcaneal spur. No radiopaque foreign bodies. IMPRESSION: 1. Diffuse right ankle soft tissue swelling. No fracture or subluxation. 2. Mildly progressive moderate to severe right ankle joint osteoarthritis. Electronically Signed   By: Delbert Phenix M.D.   On: 05/18/2020 09:10     ASSESSMENT and PLAN  1. Arthritis of right ankle rx for melixicam given. Advised use brace. Xray worsening arthritis. Referring to podiatry.  - DG Ankle Complete Right - Ambulatory referral to Podiatry  Other orders - meloxicam (MOBIC) 15 MG tablet; Take 1 tablet (15 mg total) by mouth daily as needed for pain (take with food).  Return if symptoms worsen or fail to improve.    Myles Lipps, MD Primary Care at [facility] [address] [place], [place] [address] Ph.  [phone] Fax [phone]

## 2020-05-21 ENCOUNTER — Telehealth: Payer: Self-pay | Admitting: General Practice

## 2020-05-21 NOTE — Telephone Encounter (Signed)
I called pt after speaking with Dr. Leretha Pol. I informed the pt that per my conversation with the provider that the Meloxicam that was sent in was correct, and that Dr. Leretha Pol didn't want to give her a cream because she wanted the pt to be able to ware a brace. Pt seemed to understand this information and agreed to the treatment.

## 2020-05-21 NOTE — Telephone Encounter (Signed)
PT IS CALLING BECAUSE SHE WENT TO PICK UP HER PRESCRIPTION THAT WAS SUPPOSED  TO HAVE BEEN A CREAM SENT IN  AND WE SENT  meloxicam (MOBIC) 15 MG tablet [[id number]]   PLEASE ADVISE

## 2020-06-04 ENCOUNTER — Encounter: Payer: Self-pay | Admitting: Podiatry

## 2020-06-04 ENCOUNTER — Ambulatory Visit (INDEPENDENT_AMBULATORY_CARE_PROVIDER_SITE_OTHER): Payer: Medicare Other

## 2020-06-04 ENCOUNTER — Ambulatory Visit (INDEPENDENT_AMBULATORY_CARE_PROVIDER_SITE_OTHER): Payer: Medicare Other | Admitting: Podiatry

## 2020-06-04 ENCOUNTER — Other Ambulatory Visit: Payer: Self-pay

## 2020-06-04 DIAGNOSIS — M19071 Primary osteoarthritis, right ankle and foot: Secondary | ICD-10-CM

## 2020-06-04 NOTE — Progress Notes (Signed)
Subjective:  Patient ID: Sabrina Mann, female    DOB: [birth year],  MRN: [id number] HPI No chief complaint on file.   59 y.o. female presents with the above complaint.   ROS: Denies fever chills nausea vomiting muscle aches pains calf pain back pain chest pain shortness of breath.  Relates an injury to her right foot approximately 30 years ago and states that about 10 years ago she started to develop pain was seen by primary provider who suggested that she might have arthritis.  She has tried oral anti-inflammatories but she does not like to take it states that they have not worked for her nor have the narcotics work for her that she was provided.  Past Medical History:  Diagnosis Date  . Arthritis of right ankle   . Chronic lower back pain   . GERD (gastroesophageal reflux disease)   . Seasonal allergies    No past surgical history on file.  Current Outpatient Medications:  .  albuterol (PROVENTIL HFA;VENTOLIN HFA) 108 (90 Base) MCG/ACT inhaler, Inhale 2 puffs into the lungs every 6 (six) hours as needed for wheezing or shortness of breath., Disp: 1 Inhaler, Rfl: 3 .  buPROPion (WELLBUTRIN) 75 MG tablet, Take 1 tablet (75 mg total) by mouth daily., Disp: 30 tablet, Rfl: 5 .  esomeprazole (NEXIUM) 40 MG capsule, Take 1 capsule (40 mg total) by mouth daily at 12 noon., Disp: 30 capsule, Rfl: 2 .  meloxicam (MOBIC) 15 MG tablet, Take 1 tablet (15 mg total) by mouth daily as needed for pain (take with food)., Disp: 30 tablet, Rfl: 2 .  montelukast (SINGULAIR) 10 MG tablet, Take 1 tablet (10 mg total) by mouth at bedtime., Disp: 30 tablet, Rfl: 3 .  traMADol-acetaminophen (ULTRACET) 37.5-325 MG tablet, tramadol 37.5 mg-acetaminophen 325 mg tablet  Take 1 tablet 3 times a day by oral route as needed for 30 days., Disp: , Rfl:  .  triamcinolone (NASACORT) 55 MCG/ACT AERO nasal inhaler, Place 1 spray into the nose 2 (two) times daily., Disp: 1 Inhaler, Rfl: 12  No Known Allergies Review of  Systems Objective:  There were no vitals filed for this visit.  General: Well developed, nourished, in no acute distress, alert and oriented x3   Dermatological: Skin is warm, dry and supple bilateral. Nails x 10 are well maintained; remaining integument appears unremarkable at this time. There are no open sores, no preulcerative lesions, no rash or signs of infection present.  Vascular: Dorsalis Pedis artery and Posterior Tibial artery pedal pulses are 2/4 bilateral with immedate capillary fill time. Pedal hair growth present. No varicosities and no lower extremity edema present bilateral.   Neruologic: Grossly intact via light touch bilateral. Vibratory intact via tuning fork bilateral. Protective threshold with Semmes Wienstein monofilament intact to all pedal sites bilateral. Patellar and Achilles deep tendon reflexes 2+ bilateral. No Babinski or clonus noted bilateral.   Musculoskeletal: No gross boney pedal deformities bilateral. No pain, crepitus, or limitation noted with foot and ankle range of motion bilateral. Muscular strength 5/5 in all groups tested bilateral.  She has palpable hypertrophy of the bone around the ankle she has limited range of motion of the ankle joint particularly dorsiflexion plantarflexion is full however.  She has no crepitation on range of motion.  She has a slight shortening of the right leg compared to left.  Gait: Unassisted, Nonantalgic.    Radiographs:  Radiographs of the right foot ankle demonstrate an osseously mature individual with severe osseous breakdown around the  ankle.  It appears that she has had an old fracture of the posterior process of the talus an injury to the tibiotalar joint that has resulted in osteoarthritic changes hypertrophy of the bone joint space narrowing subchondral sclerosis and eburnation with cystic formation.  There is malalignment on anterior view.  Some subtalar joint involvement as well.    Assessment & Plan:    Assessment: Severe osteoarthritis ankle right.  Plan: Discussed etiology pathology conservative versus surgical therapies.  We talked about fusion or joint arthroplasty of the ankle today.  She states that it does not hurt bad enough for that.  Also gave her another option of an [place] type brace to hold the ankle stable and prevent range of motion thus preventing as much discomfort.  We also discussed the need for anti-inflammatories states that she does not like to take pills of Aleve use likes to have some type of syrup.  I expressed to her that there are topical anti-inflammatory such as Voltaren gel and recommended that she try that.  I wrote the name down for her today she will think about all of the surgical and nonsurgical possibilities for this foot and I will follow-up with her on an as-needed basis.     Lucien Budney T. [place], [place]

## 2020-06-21 ENCOUNTER — Other Ambulatory Visit: Payer: Self-pay | Admitting: Family Medicine

## 2020-06-21 NOTE — Telephone Encounter (Signed)
Requested Prescriptions  Pending Prescriptions Disp Refills  . buPROPion (WELLBUTRIN) 75 MG tablet [Pharmacy Med Name: BUPROPION HCL 75 MG TABLET] 90 tablet 1    Sig: TAKE 1 TABLET BY MOUTH EVERY DAY     Psychiatry: Antidepressants - bupropion Failed - 06/21/2020  3:27 AM      Failed - Last BP in normal range    BP Readings from Last 1 Encounters:  05/18/20 140/90         Passed - Valid encounter within last 6 months    Recent Outpatient Visits          1 month ago Arthritis of right ankle   [facility], Meda Coffee, MD   4 months ago Right lateral epicondylitis   [facility], Meda Coffee, MD   2 years ago Seasonal allergies   [facility], Meda Coffee, MD   2 years ago Seasonal allergies   [facility], Meda Coffee, MD

## 2020-08-24 ENCOUNTER — Ambulatory Visit (INDEPENDENT_AMBULATORY_CARE_PROVIDER_SITE_OTHER): Payer: Medicare Other | Admitting: Emergency Medicine

## 2020-08-24 VITALS — BP 140/90 | Ht 64.17 in | Wt 224.0 lb

## 2020-08-24 DIAGNOSIS — Z0000 Encounter for general adult medical examination without abnormal findings: Secondary | ICD-10-CM | POA: Diagnosis not present

## 2020-08-24 NOTE — Progress Notes (Signed)
Presents today for [employer] Visit   Date of last exam:05-18-2020   Interpreter used for this visit? NO  I connected with  Sabrina Mann on 08/24/20 by a telephone enabled  and verified that I am speaking with the correct person using two identifiers.   I discussed the limitations of evaluation and management by telemedicine. The patient expressed understanding and agreed to proceed.  Patient location: home  Provider location: in office  I provided 20 minutes of non face - to - face time during this encounter.  Patient Care Team: Patient, No Pcp Per as PCP - General (General Practice)   Other items to address today:  Discussed Eye/Dental Discussed immuniztion Patient has a TOC up Dr. Alvy Bimler 2/22 @ 2:20   Other Screening: Last screening for diabetes: Last lipid screening:   ADVANCE DIRECTIVES: Discussed:yes On File: no Materials Provided: yes    Functional Status Survey: Is the patient deaf or have difficulty hearing?: No Does the patient have difficulty seeing, even when wearing glasses/contacts?: No Does the patient have difficulty concentrating, remembering, or making decisions?: No Does the patient have difficulty walking or climbing stairs?: Yes Does the patient have difficulty dressing or bathing?: No Does the patient have difficulty doing errands alone such as visiting a doctor's office or shopping?: No   6CIT Screen 08/24/2020  What Year? 0 points  What month? 0 points  What time? 0 points  Count back from 20 0 points  Months in reverse 0 points  Repeat phrase 0 points  Total Score 0        Clinical Support from 08/24/2020 in Primary Care at [facility]  AUDIT-C Score 0       Home Environment:    Yes trouble climbing stairs ( r ankle pain)  Osteoarthritis.   Lives in a one story home  No scattered rugs No grab bars Adequate lighting/ no clutter   Patient Active Problem List   Diagnosis Date Noted  . Ankle pain  05/18/2020  . Arthropathy of lumbar facet joint 05/18/2020  . Degeneration of lumbosacral intervertebral disc 05/18/2020  . 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 Lumbosacral spondylosis without myelopathy 05/18/2020  . Arthritis of right ankle   . GERD (gastroesophageal reflux disease)   . Seasonal allergies   . Chronic lower back pain      Past Medical History:  Diagnosis Date  . Arthritis of right ankle   . Chronic lower back pain   . GERD (gastroesophageal reflux disease)   . Seasonal allergies      History reviewed. No pertinent surgical history.   Family History  Problem Relation Age of Onset  . Healthy Mother   . Healthy Father      Social History   Socioeconomic History  . Marital status: Single    Spouse name: Not on file  . Number of children: Not on file  . Years of education: Not on file  . Highest education level: Not on file  Occupational History  . Not on file  Tobacco Use  . Smoking status: Never Smoker  . Smokeless tobacco: Never Used  Substance and Sexual Activity  . Alcohol use: No  . Drug use: No  . Sexual activity: Not Currently  Other Topics Concern  . Not on file  Social History Narrative  . Not on file   Social Determinants of Health   Financial Resource Strain:   . Difficulty of Paying Living Expenses: Not on file  Food Insecurity:   .  Worried About Programme researcher, broadcasting/film/video in the Last Year: Not on file  . Ran Out of Food in the Last Year: Not on file  Transportation Needs:   . Lack of Transportation (Medical): Not on file  . Lack of Transportation (Non-Medical): Not on file  Physical Activity:   . Days of Exercise per Week: Not on file  . Minutes of Exercise per Session: Not on file  Stress:   . Feeling of Stress : Not on file  Social Connections:   . Frequency of Communication with Friends and Family: Not on file  . Frequency of Social Gatherings with Friends and Family: Not on file  . Attends Religious Services: Not on file  . Active  Member of Clubs or Organizations: Not on file  . Attends Banker Meetings: Not on file  . Marital Status: Not on file  Intimate Partner Violence:   . Fear of Current or Ex-Partner: Not on file  . Emotionally Abused: Not on file  . Physically Abused: Not on file  . Sexually Abused: Not on file     No Known Allergies   Prior to Admission medications   Medication Sig Start Date End Date Taking? Authorizing Provider  buPROPion (WELLBUTRIN) 75 MG tablet TAKE 1 TABLET BY MOUTH EVERY DAY 06/21/20  Yes Myles Lipps, MD  traMADol-acetaminophen (ULTRACET) 37.5-325 MG tablet tramadol 37.5 mg-acetaminophen 325 mg tablet  Take 1 tablet 3 times a day by oral route as needed for 30 days.   Yes [provider]  albuterol (PROVENTIL HFA;VENTOLIN HFA) 108 (90 Base) MCG/ACT inhaler Inhale 2 puffs into the lungs every 6 (six) hours as needed for wheezing or shortness of breath. Patient not taking: Reported on 08/24/2020 01/26/18   Myles Lipps, MD  esomeprazole (NEXIUM) 40 MG capsule Take 1 capsule (40 mg total) by mouth daily at 12 noon. Patient not taking: Reported on 08/24/2020 03/01/18   Myles Lipps, MD  montelukast (SINGULAIR) 10 MG tablet Take 1 tablet (10 mg total) by mouth at bedtime. Patient not taking: Reported on 08/24/2020 03/01/18   Myles Lipps, MD  triamcinolone (NASACORT) 55 MCG/ACT AERO nasal inhaler Place 1 spray into the nose 2 (two) times daily. Patient not taking: Reported on 08/24/2020 03/01/18   Myles Lipps, MD     Depression screen [facility] 2/9 08/24/2020 02/14/2020 03/01/2018 01/26/2018  Decreased Interest 0 0 0 0  Down, Depressed, Hopeless 0 0 0 0  PHQ - 2 Score 0 0 0 0     Fall Risk  08/24/2020 02/14/2020 03/01/2018 01/26/2018  Falls in the past year? 0 0 No No  Number falls in past yr: 0 0 - -  Injury with Fall? 0 0 - -  Follow up Falls evaluation completed;Education provided - - -      PHYSICAL EXAM: BP 140/90 Comment: not in clinic taken  from a previous visit  Ht 5' 4.17" (1.63 m)   Wt 224 lb (101.6 kg)   BMI 38.25 kg/m    Wt Readings from Last 3 Encounters:  08/24/20 224 lb (101.6 kg)  05/18/20 224 lb 6.4 oz (101.8 kg)  02/14/20 226 lb 9.6 oz (102.8 kg)      Education/Counseling provided regarding diet and exercise, prevention of chronic diseases, smoking/tobacco cessation, if applicable, and reviewed "Covered Medicare Preventive Services."

## 2020-08-24 NOTE — Patient Instructions (Addendum)
Thank you for taking time to come for your Medicare Wellness Visit. I appreciate your ongoing commitment to your health goals. Please review the following plan we discussed and let me know if I can assist you in the future.  Grant Swager LPN  Preventive Care 58-58 Years Old, Female Preventive care refers to visits with your health care provider and lifestyle choices that can promote health and wellness. This includes:  A yearly physical exam. This may also be called an annual well check.  Regular dental visits and eye exams.  Immunizations.  Screening for certain conditions.  Healthy lifestyle choices, such as eating a healthy diet, getting regular exercise, not using drugs or products that contain nicotine and tobacco, and limiting alcohol use. What can I expect for my preventive care visit? Physical exam Your health care provider will check your:  Height and weight. This may be used to calculate body mass index (BMI), which tells if you are at a healthy weight.  Heart rate and blood pressure.  Skin for abnormal spots. Counseling Your health care provider may ask you questions about your:  Alcohol, tobacco, and drug use.  Emotional well-being.  Home and relationship well-being.  Sexual activity.  Eating habits.  Work and work environment.  Method of birth control.  Menstrual cycle.  Pregnancy history. What immunizations do I need?  Influenza (flu) vaccine  This is recommended every year. Tetanus, diphtheria, and pertussis (Tdap) vaccine  You may need a Td booster every 10 years. Varicella (chickenpox) vaccine  You may need this if you have not been vaccinated. Zoster (shingles) vaccine  You may need this after age 60. Measles, mumps, and rubella (MMR) vaccine  You may need at least one dose of MMR if you were born in 1957 or later. You may also need a second dose. Pneumococcal conjugate (PCV13) vaccine  You may need this if you have certain conditions  and were not previously vaccinated. Pneumococcal polysaccharide (PPSV23) vaccine  You may need one or two doses if you smoke cigarettes or if you have certain conditions. Meningococcal conjugate (MenACWY) vaccine  You may need this if you have certain conditions. Hepatitis A vaccine  You may need this if you have certain conditions or if you travel or work in places where you may be exposed to hepatitis A. Hepatitis B vaccine  You may need this if you have certain conditions or if you travel or work in places where you may be exposed to hepatitis B. Haemophilus influenzae type b (Hib) vaccine  You may need this if you have certain conditions. Human papillomavirus (HPV) vaccine  If recommended by your health care provider, you may need three doses over 6 months. You may receive vaccines as individual doses or as more than one vaccine together in one shot (combination vaccines). Talk with your health care provider about the risks and benefits of combination vaccines. What tests do I need? Blood tests  Lipid and cholesterol levels. These may be checked every 5 years, or more frequently if you are over 50 years old.  Hepatitis C test.  Hepatitis B test. Screening  Lung cancer screening. You may have this screening every year starting at age 55 if you have a 30-pack-year history of smoking and currently smoke or have quit within the past 15 years.  Colorectal cancer screening. All adults should have this screening starting at age 50 and continuing until age 75. Your health care provider may recommend screening at age 45 if you are   at increased risk. You will have tests every 1-10 years, depending on your results and the type of screening test.  Diabetes screening. This is done by checking your blood sugar (glucose) after you have not eaten for a while (fasting). You may have this done every 1-3 years.  Mammogram. This may be done every 1-2 years. Talk with your health care provider  about when you should start having regular mammograms. This may depend on whether you have a family history of breast cancer.  BRCA-related cancer screening. This may be done if you have a family history of breast, ovarian, tubal, or peritoneal cancers.  Pelvic exam and Pap test. This may be done every 3 years starting at age 21. Starting at age 30, this may be done every 5 years if you have a Pap test in combination with an HPV test. Other tests  Sexually transmitted disease (STD) testing.  Bone density scan. This is done to screen for osteoporosis. You may have this scan if you are at high risk for osteoporosis. Follow these instructions at home: Eating and drinking  Eat a diet that includes fresh fruits and vegetables, whole grains, lean protein, and low-fat dairy.  Take vitamin and mineral supplements as recommended by your health care provider.  Do not drink alcohol if: ? Your health care provider tells you not to drink. ? You are pregnant, may be pregnant, or are planning to become pregnant.  If you drink alcohol: ? Limit how much you have to 0-1 drink a day. ? Be aware of how much alcohol is in your drink. In the [place] one drink equals one 12 oz bottle of beer (355 mL), one 5 oz glass of wine (148 mL), or one 1 oz glass of hard liquor (44 mL). Lifestyle  Take daily care of your teeth and gums.  Stay active. Exercise for at least 30 minutes on 5 or more days each week.  Do not use any products that contain nicotine or tobacco, such as cigarettes, e-cigarettes, and chewing tobacco. If you need help quitting, ask your health care provider.  If you are sexually active, practice safe sex. Use a condom or other form of birth control (contraception) in order to prevent pregnancy and STIs (sexually transmitted infections).  If told by your health care provider, take low-dose aspirin daily starting at age 50. What's next?  Visit your health care provider once a year for a well  check visit.  Ask your health care provider how often you should have your eyes and teeth checked.  Stay up to date on all vaccines. This information is not intended to replace advice given to you by your health care provider. Make sure you discuss any questions you have with your health care provider. Document Revised: 05/17/2018 Document Reviewed: 05/17/2018 Elsevier Patient Education  2020 [employer]  

## 2020-10-15 DIAGNOSIS — H524 Presbyopia: Secondary | ICD-10-CM | POA: Diagnosis not present

## 2020-10-15 DIAGNOSIS — H52223 Regular astigmatism, bilateral: Secondary | ICD-10-CM | POA: Diagnosis not present

## 2020-10-15 DIAGNOSIS — H5203 Hypermetropia, bilateral: Secondary | ICD-10-CM | POA: Diagnosis not present

## 2020-10-21 ENCOUNTER — Ambulatory Visit (INDEPENDENT_AMBULATORY_CARE_PROVIDER_SITE_OTHER): Payer: Medicare Other | Admitting: Emergency Medicine

## 2020-10-21 ENCOUNTER — Other Ambulatory Visit: Payer: Self-pay

## 2020-10-21 VITALS — BP 145/100 | HR 81 | Temp 97.7°F | Resp 16 | Ht 63.0 in | Wt 230.0 lb

## 2020-10-21 DIAGNOSIS — K219 Gastro-esophageal reflux disease without esophagitis: Secondary | ICD-10-CM

## 2020-10-21 DIAGNOSIS — J302 Other seasonal allergic rhinitis: Secondary | ICD-10-CM

## 2020-10-21 MED ORDER — TRIAMCINOLONE ACETONIDE 55 MCG/ACT NA AERO: 1.0000 | INHALATION_SPRAY | Freq: Two times a day (BID) | NASAL | 12 refills | Status: AC

## 2020-10-21 MED ORDER — ALBUTEROL SULFATE HFA 108 (90 BASE) MCG/ACT IN AERS: 2.0000 | INHALATION_SPRAY | Freq: Four times a day (QID) | RESPIRATORY_TRACT | 3 refills | Status: AC | PRN

## 2020-10-21 MED ORDER — ESOMEPRAZOLE MAGNESIUM 40 MG PO CPDR: 40.0000 mg | DELAYED_RELEASE_CAPSULE | Freq: Every day | ORAL | 3 refills | Status: AC

## 2020-10-21 NOTE — Patient Instructions (Addendum)
   If you have lab work done today you will be contacted with your lab results within the next 2 weeks.  If you have not heard from us then please contact us. The fastest way to get your results is to register for My Chart.   IF you received an x-ray today, you will receive an invoice from [facility]. Please contact [facility] at [phone] with questions or concerns regarding your invoice.   IF you received labwork today, you will receive an invoice from [facility]. Please contact [facility] at [phone] with questions or concerns regarding your invoice.   Our billing staff will not be able to assist you with questions regarding bills from these companies.  You will be contacted with the lab results as soon as they are available. The fastest way to get your results is to activate your My Chart account. Instructions are located on the last page of this paperwork. If you have not heard from us regarding the results in 2 weeks, please contact this office.     Health Maintenance, Female Adopting a healthy lifestyle and getting preventive care are important in promoting health and wellness. Ask your health care provider about:  The right schedule for you to have regular tests and exams.  Things you can do on your own to prevent diseases and keep yourself healthy. What should I know about diet, weight, and exercise? Eat a healthy diet  Eat a diet that includes plenty of vegetables, fruits, low-fat dairy products, and lean protein.  Do not eat a lot of foods that are high in solid fats, added sugars, or sodium.   Maintain a healthy weight Body mass index (BMI) is used to identify weight problems. It estimates body fat based on height and weight. Your health care provider can help determine your BMI and help you achieve or maintain a healthy weight. Get regular exercise Get regular exercise. This is one of the most important things you can do for your health. Most  adults should:  Exercise for at least 150 minutes each week. The exercise should increase your heart rate and make you sweat (moderate-intensity exercise).  Do strengthening exercises at least twice a week. This is in addition to the moderate-intensity exercise.  Spend less time sitting. Even light physical activity can be beneficial. Watch cholesterol and blood lipids Have your blood tested for lipids and cholesterol at 59 years of age, then have this test every 5 years. Have your cholesterol levels checked more often if:  Your lipid or cholesterol levels are high.  You are older than 59 years of age.  You are at high risk for heart disease. What should I know about cancer screening? Depending on your health history and family history, you may need to have cancer screening at various ages. This may include screening for:  Breast cancer.  Cervical cancer.  Colorectal cancer.  Skin cancer.  Lung cancer. What should I know about heart disease, diabetes, and high blood pressure? Blood pressure and heart disease  High blood pressure causes heart disease and increases the risk of stroke. This is more likely to develop in people who have high blood pressure readings, are of African descent, or are overweight.  Have your blood pressure checked: ? Every 3-5 years if you are 18-39 years of age. ? Every year if you are 40 years old or older. Diabetes Have regular diabetes screenings. This checks your fasting blood sugar level. Have the screening done:  Once every   three years after age 40 if you are at a normal weight and have a low risk for diabetes.  More often and at a younger age if you are overweight or have a high risk for diabetes. What should I know about preventing infection? Hepatitis B If you have a higher risk for hepatitis B, you should be screened for this virus. Talk with your health care provider to find out if you are at risk for hepatitis B infection. Hepatitis  C Testing is recommended for:  Everyone born from 1945 through 1965.  Anyone with known risk factors for hepatitis C. Sexually transmitted infections (STIs)  Get screened for STIs, including gonorrhea and chlamydia, if: ? You are sexually active and are younger than 59 years of age. ? You are older than 59 years of age and your health care provider tells you that you are at risk for this type of infection. ? Your sexual activity has changed since you were last screened, and you are at increased risk for chlamydia or gonorrhea. Ask your health care provider if you are at risk.  Ask your health care provider about whether you are at high risk for HIV. Your health care provider may recommend a prescription medicine to help prevent HIV infection. If you choose to take medicine to prevent HIV, you should first get tested for HIV. You should then be tested every 3 months for as long as you are taking the medicine. Pregnancy  If you are about to stop having your period (premenopausal) and you may become pregnant, seek counseling before you get pregnant.  Take 400 to 800 micrograms (mcg) of folic acid every day if you become pregnant.  Ask for birth control (contraception) if you want to prevent pregnancy. Osteoporosis and menopause Osteoporosis is a disease in which the bones lose minerals and strength with aging. This can result in bone fractures. If you are 65 years old or older, or if you are at risk for osteoporosis and fractures, ask your health care provider if you should:  Be screened for bone loss.  Take a calcium or vitamin D supplement to lower your risk of fractures.  Be given hormone replacement therapy (HRT) to treat symptoms of menopause. Follow these instructions at home: Lifestyle  Do not use any products that contain nicotine or tobacco, such as cigarettes, e-cigarettes, and chewing tobacco. If you need help quitting, ask your health care provider.  Do not use street  drugs.  Do not share needles.  Ask your health care provider for help if you need support or information about quitting drugs. Alcohol use  Do not drink alcohol if: ? Your health care provider tells you not to drink. ? You are pregnant, may be pregnant, or are planning to become pregnant.  If you drink alcohol: ? Limit how much you use to 0-1 drink a day. ? Limit intake if you are breastfeeding.  Be aware of how much alcohol is in your drink. In the [place] one drink equals one 12 oz bottle of beer (355 mL), one 5 oz glass of wine (148 mL), or one 1 oz glass of hard liquor (44 mL). General instructions  Schedule regular health, dental, and eye exams.  Stay current with your vaccines.  Tell your health care provider if: ? You often feel depressed. ? You have ever been abused or do not feel safe at home. Summary  Adopting a healthy lifestyle and getting preventive care are important in promoting health and wellness.    Follow your health care provider's instructions about healthy diet, exercising, and getting tested or screened for diseases.  Follow your health care provider's instructions on monitoring your cholesterol and blood pressure. This information is not intended to replace advice given to you by your health care provider. Make sure you discuss any questions you have with your health care provider. Document Revised: 08/29/2018 Document Reviewed: 08/29/2018 Elsevier Patient Education  2021 Elsevier Inc.  

## 2020-10-21 NOTE — Progress Notes (Signed)
Sabrina Mann 60 y.o.   Chief Complaint  Patient presents with  . Transitions Of Care    HISTORY OF PRESENT ILLNESS: This is a 60 y.o. female former patient of Dr. Leretha Mann here today for possible transition of care. Medical history reviewed with patient.  Requesting medication refills for allergies and GERD.  HPI   Prior to Admission medications   Medication Sig Start Date End Date Taking? Authorizing Provider  albuterol (PROVENTIL HFA;VENTOLIN HFA) 108 (90 Base) MCG/ACT inhaler Inhale 2 puffs into the lungs every 6 (six) hours as needed for wheezing or shortness of breath. 01/26/18  Yes Sabrina Mann, Sabrina Coffee, MD  buPROPion [facility]) 75 MG tablet TAKE 1 TABLET BY MOUTH EVERY DAY 06/21/20  Yes Sabrina Mann, Sabrina Coffee, MD  esomeprazole (NEXIUM) 40 MG capsule Take 1 capsule (40 mg total) by mouth daily at 12 noon. 03/01/18  Yes Sabrina Mann, Sabrina Coffee, MD  traMADol-acetaminophen (ULTRACET) 37.5-325 MG tablet tramadol 37.5 mg-acetaminophen 325 mg tablet  Take 1 tablet 3 times a day by oral route as needed for 30 days.   Yes [provider]  triamcinolone (NASACORT) 55 MCG/ACT AERO nasal inhaler Place 1 spray into the nose 2 (two) times daily. 03/01/18  Yes Sabrina Mann, Sabrina M, MD  montelukast (SINGULAIR) 10 MG tablet Take 1 tablet (10 mg total) by mouth at bedtime. Patient not taking: No sig reported 03/01/18   Sabrina Mann, Sabrina Coffee, MD    No Known Allergies  Patient Active Problem List   Diagnosis Date Noted  . Arthropathy of lumbar facet joint 05/18/2020  . Degeneration of lumbosacral intervertebral disc 05/18/2020  . 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 Lumbosacral spondylosis without myelopathy 05/18/2020  . Arthritis of right ankle   . GERD (gastroesophageal reflux disease)   . Seasonal allergies   . Chronic lower back pain     Past Medical History:  Diagnosis Date  . Arthritis    [employer] 10/21/2020  . Arthritis of right ankle   . Chronic lower back pain   . GERD  (gastroesophageal reflux disease)   . Seasonal allergies     No past surgical history on file.  Social History   Socioeconomic History  . Marital status: Single    Spouse name: Not on file  . Number of children: Not on file  . Years of education: Not on file  . Highest education level: Not on file  Occupational History  . Not on file  Tobacco Use  . Smoking status: Never Smoker  . Smokeless tobacco: Never Used  Substance and Sexual Activity  . Alcohol use: No  . Drug use: No  . Sexual activity: Not Currently  Other Topics Concern  . Not on file  Social History Narrative  . Not on file   Social Determinants of Health   Financial Resource Strain: Not on file  Food Insecurity: Not on file  Transportation Needs: Not on file  Physical Activity: Not on file  Stress: Not on file  Social Connections: Not on file  Intimate Partner Violence: Not on file    Family History  Problem Relation Age of Onset  . Healthy Mother   . Healthy Father      Review of Systems  Constitutional: Negative.  Negative for chills and fever.  HENT: Negative.  Negative for congestion and sore throat.   Respiratory: Negative.  Negative for cough and shortness of breath.   Cardiovascular: Negative.  Negative for chest pain and palpitations.  Gastrointestinal: Negative.  Negative  for abdominal pain, blood in stool, diarrhea, melena, nausea and vomiting.  Genitourinary: Negative.  Negative for dysuria and hematuria.  Skin: Negative.  Negative for rash.  Neurological: Negative.  Negative for dizziness and headaches.  All other systems reviewed and are negative.    Today's Vitals   10/21/20 1335  BP: (!) 145/100  Pulse: 81  Resp: 16  Temp: 97.7 F (36.5 C)  TempSrc: Temporal  SpO2: 98%  Weight: 230 lb (104.3 kg)  Height: 5\' 3"  (1.6 Mann)   Body mass index is 40.74 kg/Mann.   Physical Exam Vitals reviewed.  Constitutional:      Appearance: Normal appearance.  HENT:     Head:  Normocephalic.  Eyes:     Extraocular Movements: Extraocular movements intact.  Cardiovascular:     Rate and Rhythm: Normal rate.  Pulmonary:     Effort: Pulmonary effort is normal.  Musculoskeletal:     Cervical back: Normal range of motion.  Skin:    General: Skin is warm and dry.  Neurological:     Mental Status: She is alert and oriented to person, place, and time.  Psychiatric:        Mood and Affect: Mood normal.        Behavior: Behavior normal.      ASSESSMENT & PLAN: Present office's situation explained to patient. At this point I will not be taking any new patients. It is in this patient's best interest to find a new provider in a new office. In the meantime I will refill her present medications as requested. Clinically stable.  No medical concerns identified during this visit. Sabrina Mann was seen today for transitions of care.  Diagnoses and all orders for this visit:  Gastroesophageal reflux disease, unspecified whether esophagitis present -     esomeprazole (NEXIUM) 40 MG capsule; Take 1 capsule (40 mg total) by mouth daily at 12 noon.  Seasonal allergies -     albuterol (VENTOLIN HFA) 108 (90 Base) MCG/ACT inhaler; Inhale 2 puffs into the lungs every 6 (six) hours as needed for wheezing or shortness of breath. -     triamcinolone (NASACORT) 55 MCG/ACT AERO nasal inhaler; Place 1 spray into the nose 2 (two) times daily.    Patient Instructions       If you have lab work done today you will be contacted with your lab results within the next 2 weeks.  If you have not heard from Clydie Braun then please contact us. The fastest way to get your results is to register for My Chart.   IF you received an x-ray today, you will receive an invoice from [facility]. Please contact [facility] at [phone] with questions or concerns regarding your invoice.   IF you received labwork today, you will receive an invoice from [facility]. Please contact [facility] at  [phone] with questions or concerns regarding your invoice.   Our billing staff will not be able to assist you with questions regarding bills from these companies.  You will be contacted with the lab results as soon as they are available. The fastest way to get your results is to activate your My Chart account. Instructions are located on the last page of this paperwork. If you have not heard from [phone] regarding the results in 2 weeks, please contact this office.     Health Maintenance, Female Adopting a healthy lifestyle and getting preventive care are important in promoting health and wellness. Ask your health care provider about:  The right  schedule for you to have regular tests and exams.  Things you can do on your own to prevent diseases and keep yourself healthy. What should I know about diet, weight, and exercise? Eat a healthy diet  Eat a diet that includes plenty of vegetables, fruits, low-fat dairy products, and lean protein.  Do not eat a lot of foods that are high in solid fats, added sugars, or sodium.   Maintain a healthy weight Body mass index (BMI) is used to identify weight problems. It estimates body fat based on height and weight. Your health care provider can help determine your BMI and help you achieve or maintain a healthy weight. Get regular exercise Get regular exercise. This is one of the most important things you can do for your health. Most adults should:  Exercise for at least 150 minutes each week. The exercise should increase your heart rate and make you sweat (moderate-intensity exercise).  Do strengthening exercises at least twice a week. This is in addition to the moderate-intensity exercise.  Spend less time sitting. Even light physical activity can be beneficial. Watch cholesterol and blood lipids Have your blood tested for lipids and cholesterol at 60 years of age, then have this test every 5 years. Have your cholesterol levels checked more  often if:  Your lipid or cholesterol levels are high.  You are older than 59 years of age.  You are at high risk for heart disease. What should I know about cancer screening? Depending on your health history and family history, you may need to have cancer screening at various ages. This may include screening for:  Breast cancer.  Cervical cancer.  Colorectal cancer.  Skin cancer.  Lung cancer. What should I know about heart disease, diabetes, and high blood pressure? Blood pressure and heart disease  High blood pressure causes heart disease and increases the risk of stroke. This is more likely to develop in people who have high blood pressure readings, are of African descent, or are overweight.  Have your blood pressure checked: ? Every 3-5 years if you are 44-43 years of age. ? Every year if you are 49 years old or older. Diabetes Have regular diabetes screenings. This checks your fasting blood sugar level. Have the screening done:  Once every three years after age 31 if you are at a normal weight and have a low risk for diabetes.  More often and at a younger age if you are overweight or have a high risk for diabetes. What should I know about preventing infection? Hepatitis B If you have a higher risk for hepatitis B, you should be screened for this virus. Talk with your health care provider to find out if you are at risk for hepatitis B infection. Hepatitis C Testing is recommended for:  Everyone born from [birth year] through 1965.  Anyone with known risk factors for hepatitis C. Sexually transmitted infections (STIs)  Get screened for STIs, including gonorrhea and chlamydia, if: ? You are sexually active and are younger than 59 years of age. ? You are older than 59 years of age and your health care provider tells you that you are at risk for this type of infection. ? Your sexual activity has changed since you were last screened, and you are at increased risk for chlamydia  or gonorrhea. Ask your health care provider if you are at risk.  Ask your health care provider about whether you are at high risk for HIV. Your health care provider may recommend  a prescription medicine to help prevent HIV infection. If you choose to take medicine to prevent HIV, you should first get tested for HIV. You should then be tested every 3 months for as long as you are taking the medicine. Pregnancy  If you are about to stop having your period (premenopausal) and you may become pregnant, seek counseling before you get pregnant.  Take 400 to 800 micrograms (mcg) of folic acid every day if you become pregnant.  Ask for birth control (contraception) if you want to prevent pregnancy. Osteoporosis and menopause Osteoporosis is a disease in which the bones lose minerals and strength with aging. This can result in bone fractures. If you are 20 years old or older, or if you are at risk for osteoporosis and fractures, ask your health care provider if you should:  Be screened for bone loss.  Take a calcium or vitamin D supplement to lower your risk of fractures.  Be given hormone replacement therapy (HRT) to treat symptoms of menopause. Follow these instructions at home: Lifestyle  Do not use any products that contain nicotine or tobacco, such as cigarettes, e-cigarettes, and chewing tobacco. If you need help quitting, ask your health care provider.  Do not use street drugs.  Do not share needles.  Ask your health care provider for help if you need support or information about quitting drugs. Alcohol use  Do not drink alcohol if: ? Your health care provider tells you not to drink. ? You are pregnant, may be pregnant, or are planning to become pregnant.  If you drink alcohol: ? Limit how much you use to 0-1 drink a day. ? Limit intake if you are breastfeeding.  Be aware of how much alcohol is in your drink. In the [place] one drink equals one 12 oz bottle of beer (355 mL), one 5 oz  glass of wine (148 mL), or one 1 oz glass of hard liquor (44 mL). General instructions  Schedule regular health, dental, and eye exams.  Stay current with your vaccines.  Tell your health care provider if: ? You often feel depressed. ? You have ever been abused or do not feel safe at home. Summary  Adopting a healthy lifestyle and getting preventive care are important in promoting health and wellness.  Follow your health care provider's instructions about healthy diet, exercising, and getting tested or screened for diseases.  Follow your health care provider's instructions on monitoring your cholesterol and blood pressure. This information is not intended to replace advice given to you by your health care provider. Make sure you discuss any questions you have with your health care provider. Document Revised: 08/29/2018 Document Reviewed: 08/29/2018 Elsevier Patient Education  2021 Elsevier Inc.      Edwina Barth, MD [facility]
